# Patient Record
Sex: Male | Born: 1965 | Race: White | Hispanic: No | Marital: Married | Smoking: Never smoker
Health system: Southern US, Community
[De-identification: ages and names within clinical notes are randomized; demographics above are authoritative.]

## PROBLEM LIST (undated history)

## (undated) DIAGNOSIS — J45909 Unspecified asthma, uncomplicated: Secondary | ICD-10-CM

## (undated) HISTORY — DX: Unspecified asthma, uncomplicated: J45.909

---

## 2006-01-28 ENCOUNTER — Ambulatory Visit (HOSPITAL_COMMUNITY): Admission: RE | Admit: 2006-01-28 | Discharge: 2006-01-28 | Payer: Self-pay | Admitting: Family Medicine

## 2011-06-30 ENCOUNTER — Encounter: Payer: Self-pay | Admitting: Orthopedic Surgery

## 2011-06-30 ENCOUNTER — Ambulatory Visit: Payer: Self-pay | Admitting: Orthopedic Surgery

## 2017-05-23 ENCOUNTER — Other Ambulatory Visit (HOSPITAL_COMMUNITY): Payer: Self-pay | Admitting: Internal Medicine

## 2017-05-23 ENCOUNTER — Ambulatory Visit (HOSPITAL_COMMUNITY)
Admission: RE | Admit: 2017-05-23 | Discharge: 2017-05-23 | Disposition: A | Discharge status: Home / Self Care | Payer: Self-pay | Source: Ambulatory Visit | Attending: Internal Medicine | Admitting: Internal Medicine

## 2017-05-23 DIAGNOSIS — R509 Fever, unspecified: Secondary | ICD-10-CM | POA: Insufficient documentation

## 2017-05-23 DIAGNOSIS — R05 Cough: Secondary | ICD-10-CM

## 2017-05-23 DIAGNOSIS — R059 Cough, unspecified: Secondary | ICD-10-CM

## 2017-05-23 DIAGNOSIS — R0602 Shortness of breath: Secondary | ICD-10-CM | POA: Insufficient documentation

## 2017-05-23 DIAGNOSIS — J189 Pneumonia, unspecified organism: Secondary | ICD-10-CM | POA: Insufficient documentation

## 2018-04-04 ENCOUNTER — Encounter (INDEPENDENT_AMBULATORY_CARE_PROVIDER_SITE_OTHER): Payer: BC Managed Care – PPO | Admitting: Ophthalmology

## 2018-05-01 IMAGING — DX DG CHEST 2V
2 series · 2 of 2 positions shown · non-contrast
Comparison: None.

CLINICAL DATA: Cough, fever.

EXAM:
CHEST  2 VIEW

[chest pa]
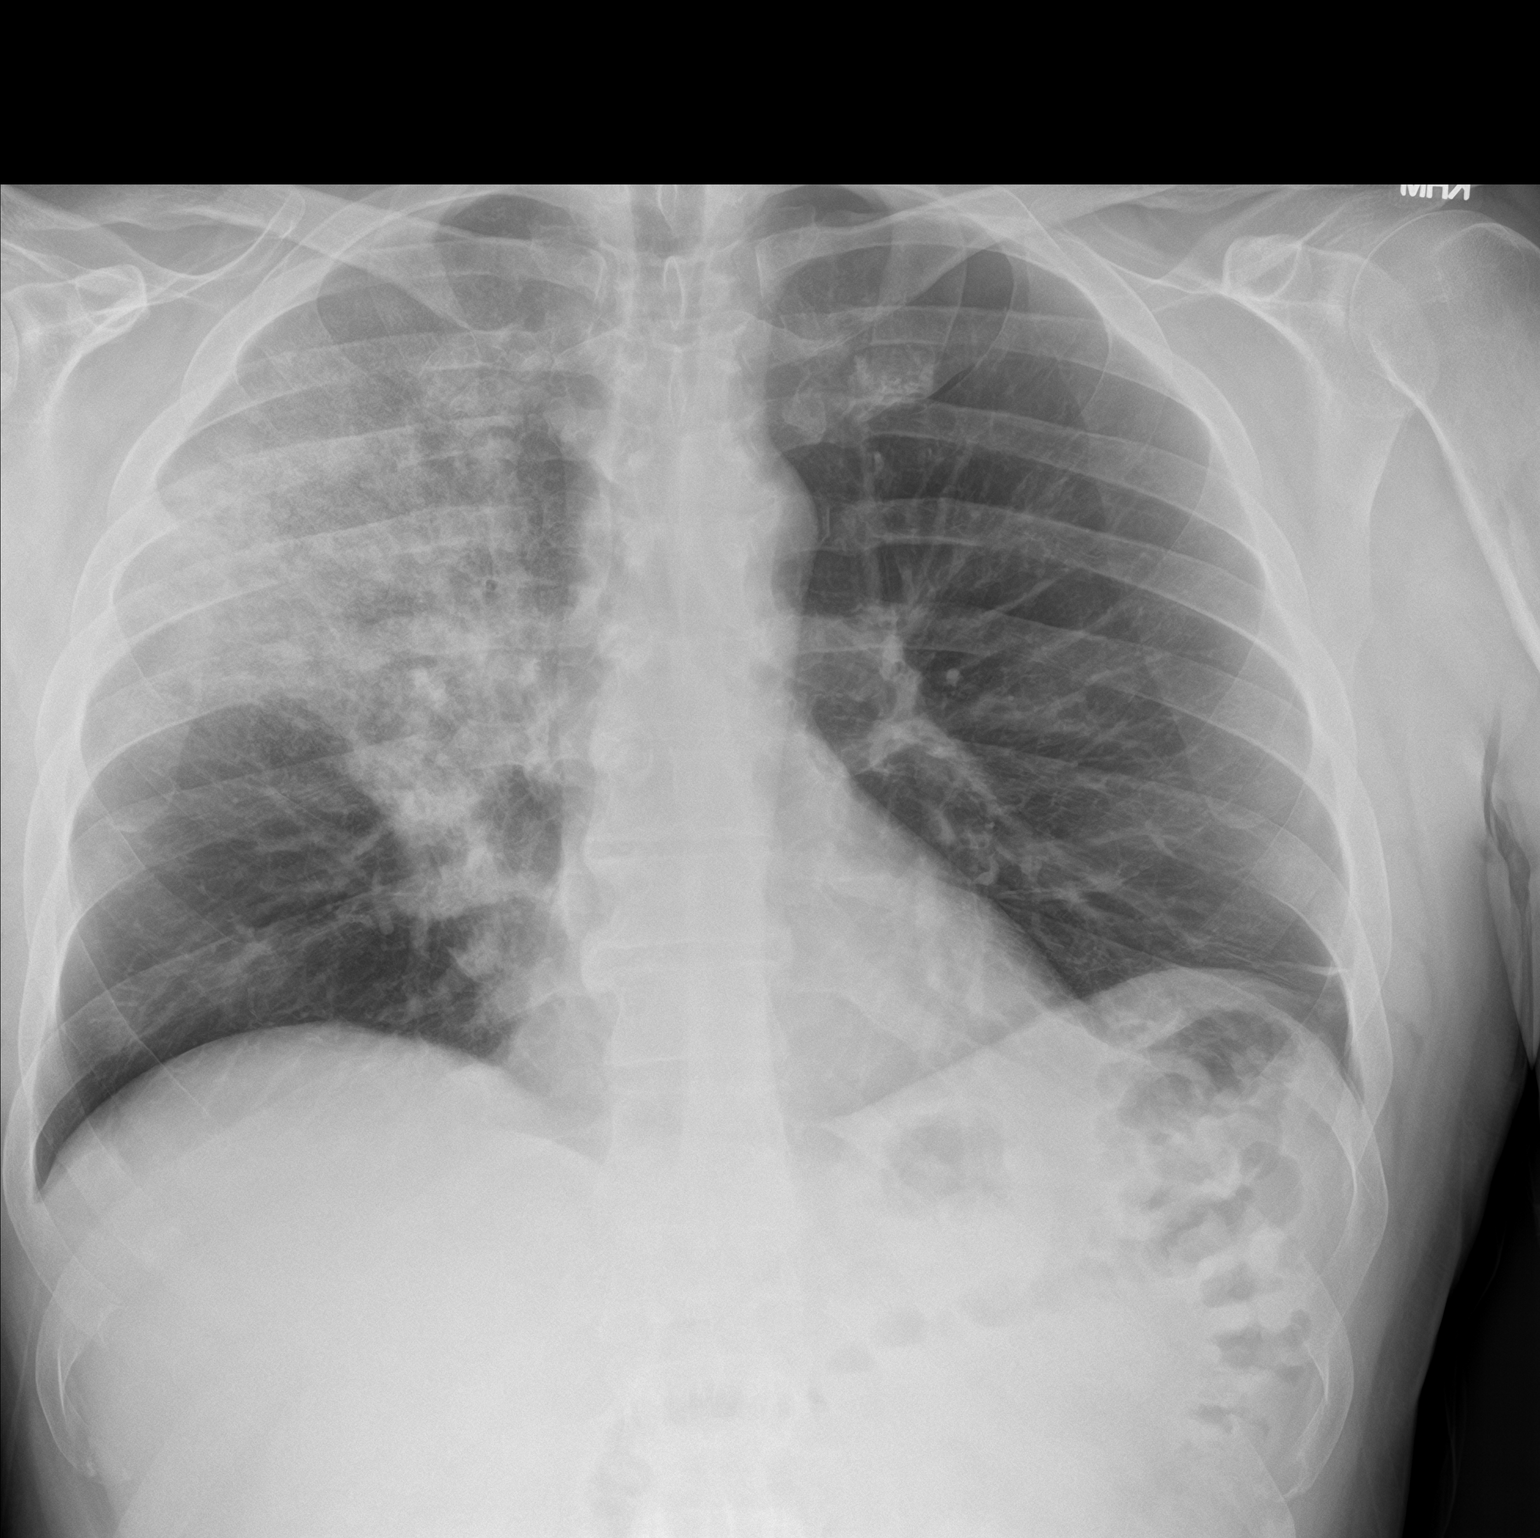

[chest lat]
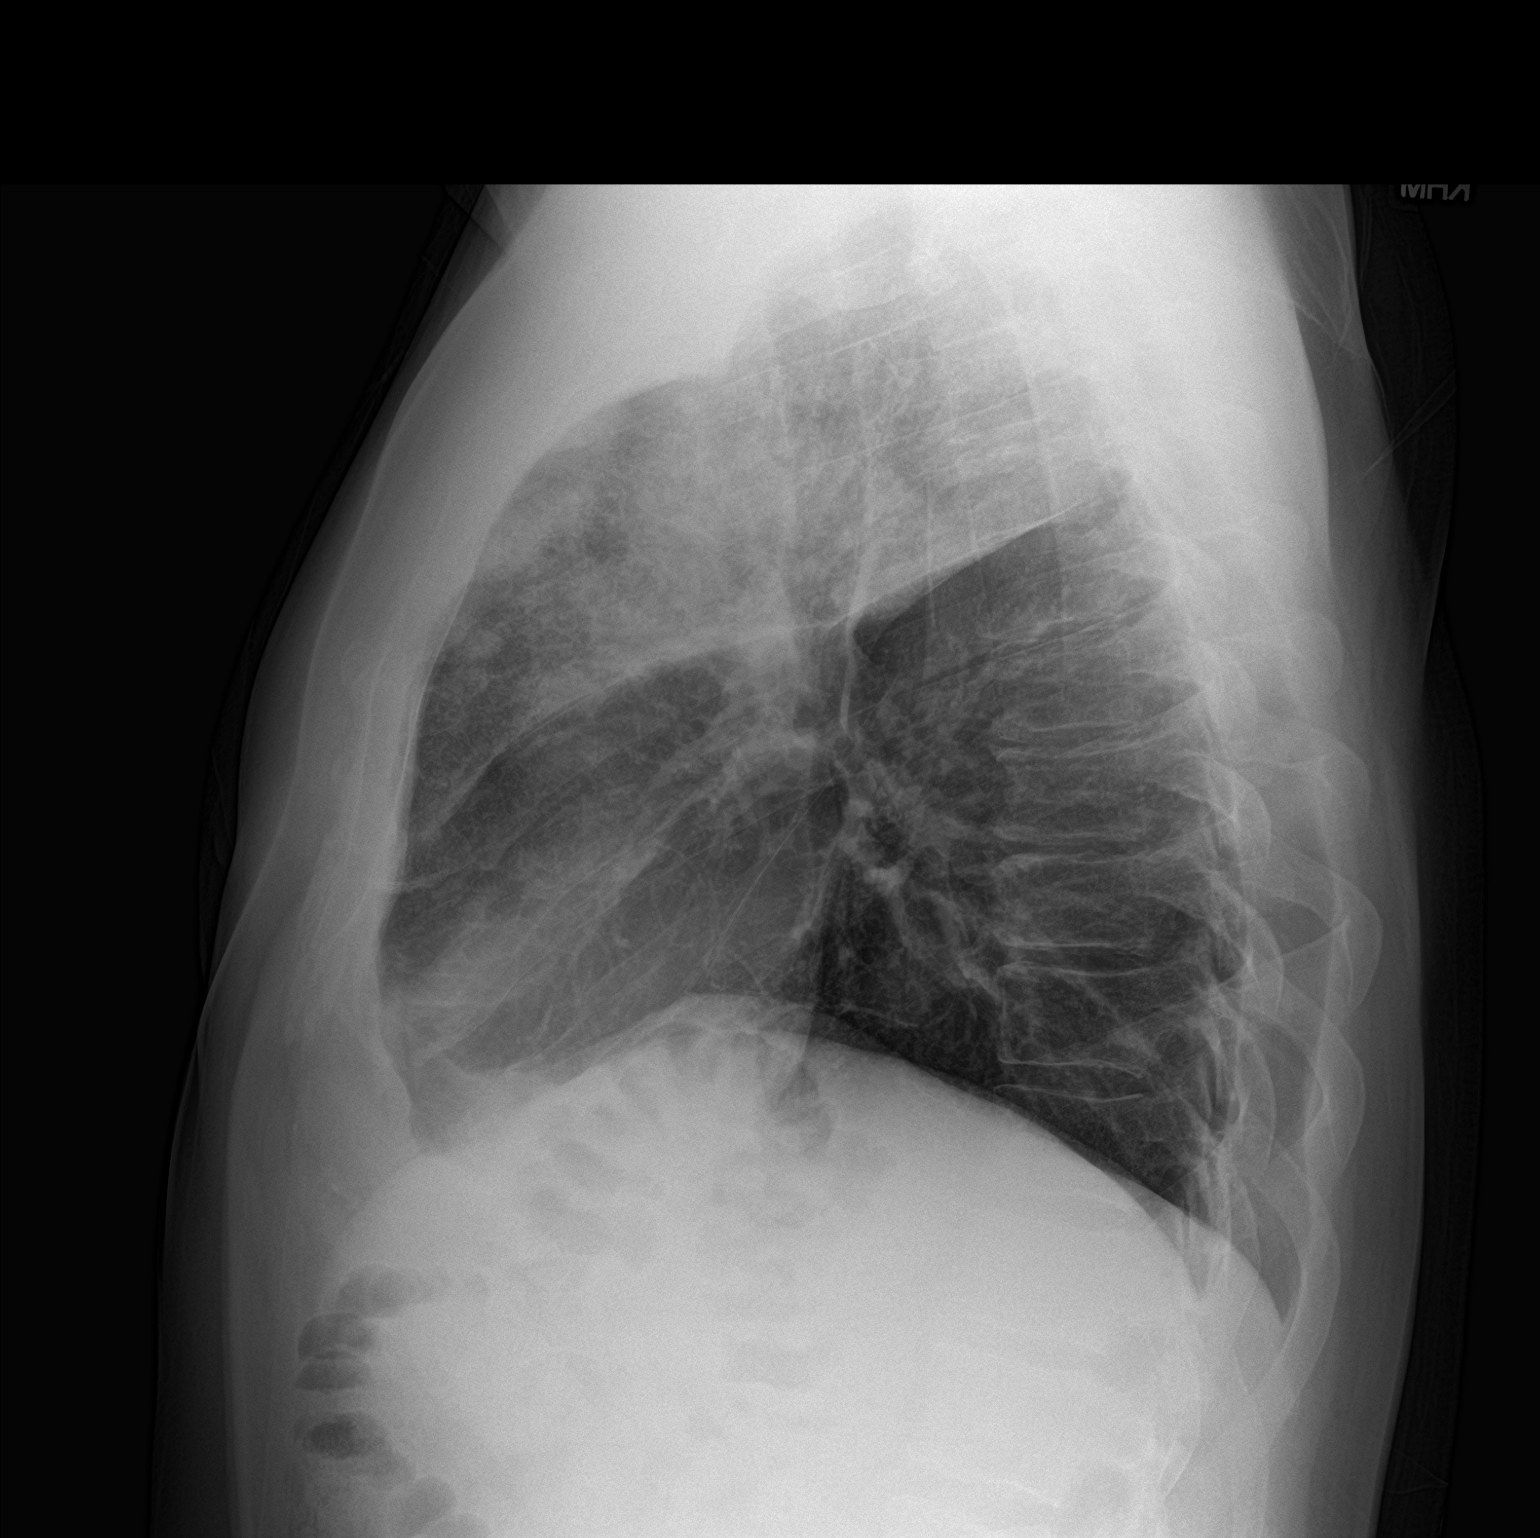

[2 of 2 positions shown; findings below may reference images not displayed]

FINDINGS: The heart size and mediastinal contours are within normal limits. No
pneumothorax or pleural effusion is noted. Left lung is clear. Right
upper lobe airspace opacity is noted consistent with pneumonia. The
visualized skeletal structures are unremarkable.
IMPRESSION: Right upper lobe pneumonia.

## 2021-03-20 ENCOUNTER — Other Ambulatory Visit: Payer: Self-pay

## 2021-03-20 ENCOUNTER — Other Ambulatory Visit (HOSPITAL_COMMUNITY): Payer: Self-pay | Admitting: *Deleted

## 2021-03-20 ENCOUNTER — Other Ambulatory Visit (HOSPITAL_COMMUNITY): Payer: Self-pay | Admitting: Student

## 2021-03-20 ENCOUNTER — Ambulatory Visit (HOSPITAL_COMMUNITY)
Admission: RE | Admit: 2021-03-20 | Discharge: 2021-03-20 | Disposition: A | Discharge status: Home / Self Care | Payer: BC Managed Care – PPO | Source: Ambulatory Visit | Attending: Student | Admitting: Student

## 2021-03-20 DIAGNOSIS — R0989 Other specified symptoms and signs involving the circulatory and respiratory systems: Secondary | ICD-10-CM

## 2021-05-27 ENCOUNTER — Institutional Professional Consult (permissible substitution): Payer: BC Managed Care – PPO | Admitting: Pulmonary Disease

## 2021-07-14 ENCOUNTER — Other Ambulatory Visit: Payer: Self-pay

## 2021-07-14 ENCOUNTER — Encounter: Payer: Self-pay | Admitting: Internal Medicine

## 2021-07-14 ENCOUNTER — Ambulatory Visit: Payer: BC Managed Care – PPO | Admitting: Internal Medicine

## 2021-07-14 DIAGNOSIS — J449 Chronic obstructive pulmonary disease, unspecified: Secondary | ICD-10-CM

## 2021-07-14 DIAGNOSIS — J4489 Other specified chronic obstructive pulmonary disease: Secondary | ICD-10-CM | POA: Insufficient documentation

## 2021-07-14 MED ORDER — MONTELUKAST SODIUM 10 MG PO TABS: ORAL_TABLET | ORAL | 2 refills | Status: AC

## 2021-07-14 MED ORDER — AMOXICILLIN-POT CLAVULANATE 875-125 MG PO TABS: 1.0000 | ORAL_TABLET | Freq: Two times a day (BID) | ORAL | 0 refills | Status: AC

## 2021-07-14 MED ORDER — BUDESONIDE-FORMOTEROL FUMARATE 160-4.5 MCG/ACT IN AERO: INHALATION_SPRAY | RESPIRATORY_TRACT | 12 refills | Status: DC

## 2021-07-14 MED ORDER — PREDNISONE 10 MG PO TABS: ORAL_TABLET | ORAL | 0 refills | Status: DC

## 2021-07-14 MED ORDER — BREZTRI AEROSPHERE 160-9-4.8 MCG/ACT IN AERO: 2.0000 | INHALATION_SPRAY | Freq: Two times a day (BID) | RESPIRATORY_TRACT | 0 refills | Status: DC

## 2021-07-14 NOTE — Progress Notes (Signed)
Russell Gates, male    DOB: [birth year],    MRN: [id number]   Brief patient profile:  68 yowm never smoker  onset of allergy/ asthma late teens and completely gone by mid 20s s need for any inhalers or allergy shots then around 2017 noted  intermittent esp in fall nasal congestion then more perennial  x 2020 assoc with sob and constant sensation of nasal congestion esp in ams with green tint so referred to pulmonary clinic in [facility]  07/14/2021 by Dr Delphina Cahill for dx of Chronic rhinitis AB x sev  month remission p abx/ prednisone started back up July 2022 and daily symptoms since.     History of Present Illness  07/14/2021  Pulmonary/ 1st office eval/ Ambri Miltner / [facility]  Chief Complaint  Patient presents with   Consult    cough, congestion,ear drainage, sob with exertion. 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      Also denies any obvious fluctuation of symptoms with weather or environmental changes or other aggravating or alleviating factors except as outlined above   No unusual exposure hx or h/o childhood pna/ asthma or knowledge of premature birth.  Current Allergies, Complete Past Medical History, Past Surgical History, Family History, and Social History were reviewed in [employer] record.  ROS  The following are not active complaints unless bolded Hoarseness, sore throat, dysphagia, dental problems, itching, sneezing,  nasal congestion or discharge of excess mucus or purulent secretions, ear ache,   fever, chills, sweats, unintended wt loss  or wt gain, classically pleuritic or exertional cp,  orthopnea pnd or arm/hand swelling  or leg swelling, presyncope, palpitations, abdominal pain, anorexia, nausea, vomiting, diarrhea  or change in bowel habits or change in bladder habits, change in stools or change in urine, dysuria, hematuria,  rash, arthralgias, visual complaints, headache, numbness, weakness or ataxia or problems with walking or coordination,  change in mood or  memory.             No past medical history on file.  Outpatient Medications Prior to Visit  Medication Sig Dispense Refill   albuterol (PROVENTIL) (2.5 MG/3ML) 0.083% nebulizer solution Take by nebulization.     albuterol (VENTOLIN HFA) 108 (90 Base) MCG/ACT inhaler Inhale 2 puffs into the lungs every 4 (four) hours as needed.     No facility-administered medications prior to visit.     Objective:     BP 120/78   Pulse 80   Temp 98.2 F (36.8 C)   Ht 6' (1.829 m)   Wt 215 lb 0.6 oz (97.5 kg)   SpO2 97%   BMI 29.16 kg/m   SpO2: 97 %  Pleasant amb wm nad   HEENT : pt wearing mask not removed for exam due to covid -19 concerns.    NECK :  without JVD/Nodes/TM/ nl carotid upstrokes bilaterally   LUNGS: no acc muscle use,  Nl contour chest which is clear to A and P bilaterally  without cough on insp or exp maneuvers   CV:  RRR  no s3 or murmur or increase in P2, and no edema   ABD:  soft and nontender with nl inspiratory excursion in the supine position. No bruits or organomegaly appreciated, bowel sounds nl  MS:  Nl gait/ ext warm without deformities, calf tenderness, cyanosis or clubbing No obvious joint restrictions   SKIN: warm and dry without lesions    NEURO:  alert, approp, nl sensorium with  no motor or cerebellar deficits apparent.     Labs ordered 07/14/2021  :  allergy profile        I personally reviewed images and agree with radiology impression as follows:  CXR:   03/20/21  No active cardiopulmonary disease.       Assessment   No problem-specific Assessment & Plan notes found for this encounter.     Sandrea Hughs, MD 07/14/2021

## 2021-07-14 NOTE — Assessment & Plan Note (Addendum)
Onset around 2017 initially just in fall there year round since 2020  -  07/14/2021  After extensive coaching inhaler device,  effectiveness =   80% > try symbicort 160 and singulair 10 mg q pm  -  Allergy profile 07/14/2021 >  Eos 0. /  IgE  Pending   Classic rhinitis/ AB pattern strongly suggests this is all allergy related so for now try  1) symbicort 160 2bid and prn saba Re SABA :  I spent extra time with pt today reviewing appropriate use of albuterol for prn use on exertion with the following points: 1) saba is for relief of sob that does not improve by walking a slower pace or resting but rather if the pt does not improve after trying this first. 2) If the pt is convinced, as many are, that saba helps recover from activity faster then it's easy to tell if this is the case by re-challenging : ie stop, take the inhaler, then p 5 minutes try the exact same activity (intensity of workload) that just caused the symptoms and see if they are substantially diminished or not after saba 3) if there is an activity that reproducibly causes the symptoms, try the saba 15 min before the activity on alternate days   If in fact the saba really does help, then fine to continue to use it prn but advised may need to look closer at the maintenance regimen being used to achieve better control of airways disease with exertion.   2) Singulair 10 mg q pm  3) repeat pred x 6 days/ augmentin x 10 days   4) f/u in 6 weeks with refer to allergy and / or sinus CT next steps if not improving.          Each maintenance medication was reviewed in detail including emphasizing most importantly the difference between maintenance and prns and under what circumstances the prns are to be triggered using an action plan format where appropriate.  Total time for H and P, chart review, counseling, reviewing hfa device(s) and generating customized AVS unique to this new pt office visit / same day charting = 

## 2021-07-14 NOTE — Addendum Note (Signed)
Addended by: Carleene Mains D on: 07/14/2021 05:07 PM   Modules accepted: Orders

## 2021-07-14 NOTE — Patient Instructions (Addendum)
Plan A = Automatic = Always=    symbicort 160 Take 2 puffs first thing in am and then another 2 puffs about 12 hours later.    Work on inhaler technique:  relax and gently blow all the way out then take a nice smooth full deep breath back in, triggering the inhaler at same time you start breathing in.  Hold for up to 5 seconds if you can. Blow out thru nose. Rinse and gargle with water when done.  If mouth or throat bother you at all,  try brushing teeth/gums/tongue with arm and hammer toothpaste/ make a slurry and gargle and spit out.     Singulair 10 mg each pm   Plan B = Backup (to supplement plan A, not to replace it) Only use your albuterol inhaler as a rescue medication to be used if you can't catch your breath by resting or doing a relaxed purse lip breathing pattern.  - The less you use it, the better it will work when you need it. - Ok to use the inhaler up to 2 puffs  every 4 hours if you must but call for appointment if use goes up over your usual need - Don't leave home without it !!  (think of it like the spare tire for your car)   Plan C = Crisis (instead of Plan B but only if Plan B stops working) - only use your albuterol nebulizer if you first try Plan B and it fails to help > ok to use the nebulizer up to every 4 hours but if start needing it regularly call for immediate appointment  Augmentin 875 mg take one pill twice daily  X 10 days - take at breakfast and supper with large glass of water.  It would help reduce the usual side effects (diarrhea and yeast infections) if you ate cultured yogurt at lunch.   Prednisone 10 mg take  4 each am x 2 days,   2 each am x 2 days,  1 each am x 2 days and stop     Please remember to go to the lab department @ [facility] for your tests - we will call you with the results when they are available.       Please schedule a follow up office visit in 6 weeks, call sooner if needed

## 2021-07-21 LAB — CBC WITH DIFFERENTIAL/PLATELET
Basophils Absolute: 0.1 10*3/uL (ref 0.0–0.2)
Basos: 1 %
EOS (ABSOLUTE): 0.4 10*3/uL (ref 0.0–0.4)
Eos: 6 %
Hematocrit: 41.7 % (ref 37.5–51.0)
Hemoglobin: 14.3 g/dL (ref 13.0–17.7)
Immature Grans (Abs): 0 10*3/uL (ref 0.0–0.1)
Immature Granulocytes: 0 %
Lymphocytes Absolute: 2.3 10*3/uL (ref 0.7–3.1)
Lymphs: 34 %
MCH: 31.9 pg (ref 26.6–33.0)
MCHC: 34.3 g/dL (ref 31.5–35.7)
MCV: 93 fL (ref 79–97)
Monocytes Absolute: 0.7 10*3/uL (ref 0.1–0.9)
Monocytes: 10 %
Neutrophils Absolute: 3.3 10*3/uL (ref 1.4–7.0)
Neutrophils: 49 %
Platelets: 255 10*3/uL (ref 150–450)
RBC: 4.48 x10E6/uL (ref 4.14–5.80)
RDW: 12 % (ref 11.6–15.4)
WBC: 6.8 10*3/uL (ref 3.4–10.8)

## 2021-07-21 LAB — IGE: IgE (Immunoglobulin E), Serum: 836 IU/mL — ABNORMAL HIGH (ref 6–495)

## 2021-07-22 ENCOUNTER — Other Ambulatory Visit: Payer: Self-pay

## 2021-07-22 DIAGNOSIS — T7840XA Allergy, unspecified, initial encounter: Secondary | ICD-10-CM

## 2021-07-22 NOTE — Progress Notes (Deleted)
Spoke to patient. He states he feels he has improved a little but thinks the referral is a good idea. Order placed for referral.

## 2021-08-26 ENCOUNTER — Encounter: Payer: Self-pay | Admitting: Internal Medicine

## 2021-08-26 ENCOUNTER — Ambulatory Visit: Payer: BC Managed Care – PPO | Admitting: Internal Medicine

## 2021-08-26 ENCOUNTER — Other Ambulatory Visit: Payer: Self-pay

## 2021-08-26 DIAGNOSIS — J449 Chronic obstructive pulmonary disease, unspecified: Secondary | ICD-10-CM | POA: Diagnosis not present

## 2021-08-26 MED ORDER — AMOXICILLIN-POT CLAVULANATE 875-125 MG PO TABS: 1.0000 | ORAL_TABLET | Freq: Two times a day (BID) | ORAL | 0 refills | Status: AC

## 2021-08-26 MED ORDER — PREDNISONE 10 MG PO TABS: ORAL_TABLET | ORAL | 0 refills | Status: DC

## 2021-08-26 NOTE — Progress Notes (Signed)
Russell Gates, male    DOB: March 23, 1966,    MRN: [id number]   Brief patient profile:  55 yowm never smoker  onset of allergy/ asthma around age 55 then completely gone by early 20s s need for any inhalers or allergy shots then around 2017 noted  intermittent esp in fall nasal congestion then more perennial  x 2020 assoc with sob and constant sensation of nasal congestion esp in ams with green tint so referred to pulmonary clinic in [place]  07/14/2021 by Dr Catalina Pizza for dx of Chronic rhinitis AB x sev  month remission p abx/ prednisone started back up July 2022 and daily symptoms since.     History of Present Illness  07/14/2021  Pulmonary/ 1st office eval/ Jennessy Sandridge / [facility]  Chief Complaint  Patient presents with   Consult    cough, congestion,ear drainage, sob with exertion. 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  Work on inhaler technique:   Singulair 10 mg each pm  Plan B = Backup (to supplement plan A, not to replace it) Only use your albuterol inhaler as a rescue medication  Plan C = Crisis (instead of Plan B but only if Plan B stops working) - only use your albuterol nebulizer if you first try Plan B  Augmentin 875 mg take one pill twice daily  X 10 days  Prednisone 10 mg take  4 each am x 2 days,   2 each am x 2 days,  1 each am x 2 days and stop   Allergy profile 07/14/2021 >  Eos 0.4 /  IgE  836 > referred to allergy      08/26/2021  f/u ov/[place] office/Ioannis Schuh re: AB maint on symb 160 but not consistent with it - has allergy appt p holidays  Chief Complaint  Patient presents with    Follow-up    Cough, congestion, ear drainage have improved over past 5-7 days but still has some drainage. Patient has "flare ups" Has appt. 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    Also denies any obvious fluctuation of symptoms with weather or environmental changes or other aggravating or alleviating factors except as outlined above   No unusual exposure hx or h/o childhood pna  or knowledge of premature birth.  Current Allergies, Complete Past Medical History, Past Surgical History, Family History, and Social History were reviewed in [employer] record.  ROS  The following are not active complaints unless bolded Hoarseness, sore throat, dysphagia, dental problems, itching, sneezing,  nasal congestion or discharge of excess mucus or purulent secretions, ear ache,   fever, chills, sweats, unintended wt loss or wt gain, classically pleuritic or exertional cp,  orthopnea pnd or arm/hand swelling  or leg swelling, presyncope, palpitations, abdominal pain, anorexia, nausea, vomiting, diarrhea  or change in bowel habits or change in bladder habits, change in stools or change in urine, dysuria, hematuria,  rash, arthralgias, visual complaints, headache, numbness, weakness or ataxia or problems with walking or coordination,  change in mood or  memory.        Current Meds  Medication Sig   albuterol (PROVENTIL) (2.5 MG/3ML) 0.083% nebulizer solution Take by nebulization.    albuterol (VENTOLIN HFA) 108 (90 Base) MCG/ACT inhaler Inhale 2 puffs into the lungs every 4 (four) hours as needed.   Budeson-Glycopyrrol-Formoterol (BREZTRI AEROSPHERE) 160-9-4.8 MCG/ACT AERO Inhale 2 puffs into the lungs in the morning and at bedtime.   budesonide-formoterol (SYMBICORT) 160-4.5 MCG/ACT inhaler Take 2 puffs first thing in am and then another 2 puffs about 12 hours later.   montelukast (SINGULAIR) 10 MG tablet One at bedtime every night   predniSONE (DELTASONE) 10 MG tablet Take  4 each am x 2 days,   2 each am x 2 days,  1 each am x 2 days and stop                   Objective:    Wt Readings from Last 3 Encounters:  08/26/21 217 lb 1.3 oz (98.5 kg)  07/14/21 215 lb 0.6 oz (97.5 kg)     Vital signs reviewed  08/26/2021  - Note at rest 02 sats  98% on RA   General appearance:   amb wm  nasal tone   nad   HEENT : pt wearing mask not removed for exam due to covid -19 concerns.    NECK :  without JVD/Nodes/TM/ nl carotid upstrokes bilaterally   LUNGS: no acc muscle use,  Nl contour chest which is clear to A and P bilaterally without cough on insp or exp maneuvers   CV:  RRR  no s3 or murmur or increase in P2, and no edema   ABD:  soft and nontender with nl inspiratory excursion in the supine position. No bruits or organomegaly appreciated, bowel sounds nl  MS:  Nl gait/ ext warm without deformities, calf tenderness, cyanosis or clubbing No obvious joint restrictions   SKIN: warm and dry without lesions    NEURO:  alert, approp, nl sensorium with  no motor or cerebellar deficits apparent.          Assessment

## 2021-08-26 NOTE — Patient Instructions (Addendum)
I emphasized that nasal steroids (flonase, nasacort aq)  have no immediate benefit in terms of improving symptoms.  To help them reached the target tissue, the patient should use Afrin two puffs every 12 hours applied one min before using the nasal steroids.  Afrin should be stopped after no more than 5 days.  If the symptoms worsen, Afrin can be restarted after 5 days off of therapy to prevent rebound congestion from overuse of Afrin.  I also emphasized that in no way are nasal steroids a concern in terms of "addiction".  Augmentin 875 mg take one pill twice daily  X 10 days - take at breakfast and supper with large glass of water.  It would help reduce the usual side effects (diarrhea and yeast infections) if you ate cultured yogurt at lunch.   Prednisone 10 mg take  4 each am x 2 days,   2 each am x 2 days,  1 each am x 2 days and stop   Keep appt to see Allergy    If you are satisfied with your treatment plan,  let your doctor know and he/she can either refill your medications or you can return here when your prescription runs out.     If in any way you are not 100% satisfied,  please tell us.  If 100% better, tell your friends!  Pulmonary follow up is as needed

## 2021-08-26 NOTE — Assessment & Plan Note (Signed)
Onset around 2017 -   initially just in fall then year round since 2020  -  07/14/2021  After extensive coaching inhaler device,  effectiveness =   80% > try symbicort 160 and singulair 10 mg q pm  -  Allergy profile 07/14/2021 >  Eos 0.4 /  IgE  836 > referred to allergy   - 08/26/2021 flared though not consistent with symb 160/ assoc with nasal symptom flare - 08/26/2021 added flonase with 6 d pred/ augmentin x 10 and allergy eval  in Jan 2022 planned   Flare of symptoms related to non-adherence and poor control of nasal symptoms.  - The proper method of use, as well as anticipated side effects, of a metered-dose inhaler were discussed and demonstrated to the patient using teach back method.    - also I emphasized that nasal steroids have no immediate benefit in terms of improving symptoms.  To help them reached the target tissue, the patient should use Afrin two puffs every 12 hours applied one min before using the nasal steroids.  Afrin should be stopped after no more than 5 days.  If the symptoms worsen, Afrin can be restarted after 5 days off of therapy to prevent rebound congestion from overuse of Afrin.  I also emphasized that in no way are nasal steroids a concern in terms of "addiction".   F/u here can be prn          Each maintenance medication was reviewed in detail including emphasizing most importantly the difference between maintenance and prns and under what circumstances the prns are to be triggered using an action plan format where appropriate.  Total time for H and P, chart review, counseling, reviewing hfa/ nasal device(s) and generating customized AVS unique to this summary final pulmonary  office visit / same day charting  > 30 min

## 2021-09-24 ENCOUNTER — Ambulatory Visit: Payer: BC Managed Care – PPO | Admitting: Allergy & Immunology

## 2021-10-15 ENCOUNTER — Other Ambulatory Visit: Payer: Self-pay

## 2021-10-15 ENCOUNTER — Telehealth: Payer: Self-pay | Admitting: Internal Medicine

## 2021-10-15 MED ORDER — AMOXICILLIN-POT CLAVULANATE 875-125 MG PO TABS: 1.0000 | ORAL_TABLET | Freq: Two times a day (BID) | ORAL | 0 refills | Status: AC

## 2021-10-15 MED ORDER — PREDNISONE 10 MG PO TABS: ORAL_TABLET | ORAL | 0 refills | Status: AC

## 2021-10-15 NOTE — Telephone Encounter (Signed)
Primary Pulmonologist: Dr. Sherene Sires  Last office visit and with whom: 08/26/2021 [facility]  What do we see them for (pulmonary problems): asthmatic bronchitis Last OV assessment/plan: see below   Was appointment offered to patient (explain)?  No   Reason for call:  Congestion, Ear aches, cough up yellow/green mucus. No fever.  Neg flu and covid tests. Patient states he is having same symptoms as he did during OV and would like augmentin and prednisone.   Dr. Sherene Sires please advise  Pharmacy: [facility].  Not on File   There is no immunization history on file for this patient.  Assessment              Assessment & Plan Note by Nyoka Cowden, MD at 08/26/2021 9:38 AM  Author: Nyoka Cowden, MD Author Type: Physician Filed: 08/26/2021  9:39 AM  Note Status: Written Cosign: Cosign Not Required Encounter Date: 08/26/2021  Problem: Asthmatic bronchitis , chronic (HCC)  Editor: Nyoka Cowden, MD (Physician)             Onset around 2017 -   initially just in fall then year round since 2020  -  07/14/2021  After extensive coaching inhaler device,  effectiveness =   80% > try symbicort 160 and singulair 10 mg q pm  -  Allergy profile 07/14/2021 >  Eos 0.4 /  IgE  836 > referred to allergy   - 08/26/2021 flared though not consistent with symb 160/ assoc with nasal symptom flare - 08/26/2021 added flonase with 6 d pred/ augmentin x 10 and allergy eval  in Jan 2022 planned    Flare of symptoms related to non-adherence and poor control of nasal symptoms.   - The proper method of use, as well as anticipated side effects, of a metered-dose inhaler were discussed and demonstrated to the patient using teach back method.     - also I emphasized that nasal steroids have no immediate benefit in terms of improving symptoms.  To help them reached the target tissue, the patient should use Afrin two puffs every 12 hours applied one min before using the nasal steroids.  Afrin should be stopped  after no more than 5 days.  If the symptoms worsen, Afrin can be restarted after 5 days off of therapy to prevent rebound congestion from overuse of Afrin.  I also emphasized that in no way are nasal steroids a concern in terms of "addiction".    F/u here can be prn            Each maintenance medication was reviewed in detail including emphasizing most importantly the difference between maintenance and prns and under what circumstances the prns are to be triggered using an action plan format where appropriate.   Total time for H and P, chart review, counseling, reviewing hfa/ nasal device(s) and generating customized AVS unique to this summary final pulmonary  office visit / same day charting  > 30 min              Patient Instructions by Nyoka Cowden, MD at 08/26/2021 8:45 AM  Author: Nyoka Cowden, MD Author Type: Physician Filed: 08/26/2021  9:27 AM  Note Status: Addendum Cosign: Cosign Not Required Encounter Date: 08/26/2021  Editor: Nyoka Cowden, MD (Physician)      Prior Versions: 1. Nyoka Cowden, MD (Physician) at 08/26/2021  9:24 AM - Signed  I emphasized that nasal steroids (flonase, nasacort aq)  have no immediate benefit in  terms of improving symptoms.  To help them reached the target tissue, the patient should use Afrin two puffs every 12 hours applied one min before using the nasal steroids.  Afrin should be stopped after no more than 5 days.  If the symptoms worsen, Afrin can be restarted after 5 days off of therapy to prevent rebound congestion from overuse of Afrin.  I also emphasized that in no way are nasal steroids a concern in terms of "addiction".   Augmentin 875 mg take one pill twice daily  X 10 days - take at breakfast and supper with large glass of water.  It would help reduce the usual side effects (diarrhea and yeast infections) if you ate cultured yogurt at lunch.    Prednisone 10 mg take  4 each am x 2 days,   2 each am x 2 days,  1 each am x 2 days  and stop    Keep appt to see Allergy     If you are satisfied with your treatment plan,  let your doctor know and he/she can either refill your medications or you can return here when your prescription runs out.      If in any way you are not 100% satisfied,  please tell us.  If 100% better, tell your friends!   Pulmonary follow up is as needed

## 2021-10-15 NOTE — Telephone Encounter (Signed)
Called and spoke to patient. Medications sent to [facility] pharmacy. Nothing further needed.

## 2021-10-15 NOTE — Telephone Encounter (Signed)
Fine with me: Augmentin 875 mg take one pill twice daily  X 10 days - take at breakfast and supper with large glass of water.  It would help reduce the usual side effects (diarrhea and yeast infections) if you ate cultured yogurt at lunch.   Prednisone 10 mg take  4 each am x 2 days,   2 each am x 2 days,  1 each am x 2 days and stop

## 2021-10-27 ENCOUNTER — Other Ambulatory Visit: Payer: Self-pay

## 2021-10-27 ENCOUNTER — Encounter: Payer: Self-pay | Admitting: Allergy & Immunology

## 2021-10-27 ENCOUNTER — Ambulatory Visit: Payer: BC Managed Care – PPO | Admitting: Allergy & Immunology

## 2021-10-27 VITALS — BP 130/88 | HR 73 | Resp 18 | Ht 72.0 in

## 2021-10-27 DIAGNOSIS — K9049 Malabsorption due to intolerance, not elsewhere classified: Secondary | ICD-10-CM

## 2021-10-27 DIAGNOSIS — J4540 Moderate persistent asthma, uncomplicated: Secondary | ICD-10-CM | POA: Diagnosis not present

## 2021-10-27 DIAGNOSIS — J310 Chronic rhinitis: Secondary | ICD-10-CM | POA: Diagnosis not present

## 2021-10-27 MED ORDER — CARBINOXAMINE MALEATE 4 MG PO TABS: 4.0000 mg | ORAL_TABLET | Freq: Every day | ORAL | 5 refills | Status: AC | PRN

## 2021-10-27 MED ORDER — IPRATROPIUM BROMIDE 0.06 % NA SOLN: NASAL | 5 refills | Status: AC

## 2021-10-27 MED ORDER — PREDNISONE 10 MG PO TABS: ORAL_TABLET | ORAL | 0 refills | Status: AC

## 2021-10-27 NOTE — Progress Notes (Signed)
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 We are going to get some labs to look for serious difficult to control causes of asthma/breathing problems.  We are going to start a prednisone burst today. I also think that we need to restart the Symbicort two puffs twice daily EVERY day. I want to see if this medication can help reverse your lung function. Consider starting an injectable medication like Fasenra for better control of your breathing. This medication targets a white blood cell called eosinophils which can hang out in the airways and make your breathing worse.  Controller: Symbicort two puffs twice daily with spacer Before physical activity (if this is a trigger): albuterol 2 puffs 15 minutes before physical activity Rescue: 4 puffs of albuterol every 4-6 hours  Chronic rhinitis Testing today showed: positives only to dust mites Copy of testing results provided. Avoidance measures provided.  Start taking nasal Atrovent one spray per nostril every 8 hours as needed (CAN BE OVER DRYING, so be careful). Start taking carbinoxmine 4 mg every 8 hours as needed (CAN CAUSE SLEEPINESS, but this gets better over time).   Adverse food reaction Testing was negative to all of the foods that we tested today. Food has great negative predictive value, so if this is negative we are likely to believe it.  Testing ruled out > 95% of all food allergies today. Copy of the testing results provided today. We are  going to confirm the shellfish with some labs today.  4. Follow up in 2-4 weeks or sooner if needed.     This note in its entirety was forwarded to the Provider who requested this consultation.  Subjective:   Russell Gates is a 56 y.o. male presenting today for evaluation of  Chief Complaint  Patient presents with   Asthma   Allergic Rhinitis     Russell Gates has a history of the following: Patient Active Problem List   Diagnosis Date Noted   Asthmatic bronchitis , chronic (HCC) 07/14/2021    History obtained from: chart review and patient.  Melida Gimenez was referred by Benita Stabile, MD.     Russell Gates is a 56 y.o. male presenting for an evaluation of asthma and allergies, including food and environmental allergies .  Asthma/Respiratory Symptom History: He had asthma as a child. He was placed on Symbicort around December 2 puffs twice daily.  He has not taken the Symbicort in around a month or so. He has only taken it for a couple of days in a row during flares.    Allergic Rhinitis Symptom History: He has had continuous drainage from his sinuses. He has been on antibiotics around 3 times since November.  Antibiotics clear it up completely. He completed the last course of antibiotics one week ago today. Then today he reports that he had ear congestion. He did use a nose spray right before Christmas - this was Afrin which he has only used a couple of times. He has used something else together. He has not seen  ENT. He did have allergies and asthma and had allergy shots as a child. This was probably a couple of years. It definitely helped. As a child, he was prescribed Marax (theophylline/ephedra/hydroxyzine) which helped quite a bit. He would only take this when he had a flare up. He normally gets prednisone a couple of times per year. It does help.    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 He does have an EpiPen which he keeps  with him at all times. He has had intermittent episodes where he develops throat closure and urticaria after eating certain things, but this is not consistent. The last time that this happened was around September 2022.  This typically occurs around 12-15 hours after eating something. He did go to the ED once in [place] around two years ago. This was the worst case that he has ever had. Lips were turning blue. He had to get IV Benadryl for this episode. He passed out that time. They had gone to an [employer] and had an appetizer. This was 2-3 pm in the afternoon. Then they had [employer] for supper. He had three beers yesterday at a [employer], he had drainage from drinking the beer. This is clear drainage.    Skin Symptom History: He reports whelts on his body around 5-6 times. He is not sure that this was related to food.  Otherwise, there is no history of other atopic diseases, including drug allergies, stinging insect allergies, or contact dermatitis. There is no significant infectious history. Vaccinations are up to date.    Past Medical History: Patient Active Problem List   Diagnosis Date Noted   Asthmatic bronchitis , chronic (HCC) 07/14/2021    Medication List:  Allergies as of 10/27/2021   Not on File      Medication List        Accurate as of October 27, 2021 12:50 PM. If you have any questions, ask your nurse or doctor.          albuterol (2.5 MG/3ML) 0.083% nebulizer solution Commonly known as: PROVENTIL Take by nebulization.   albuterol 108 (90 Base) MCG/ACT inhaler Commonly known as: VENTOLIN HFA Inhale 2 puffs into the lungs every 4 (four) hours as needed.   budesonide-formoterol 160-4.5 MCG/ACT inhaler Commonly known as: Symbicort Take 2 puffs first thing in am and then another 2 puffs about 12 hours later.   Carbinoxamine Maleate 4 MG Tabs Take 1 tablet (4 mg total) by mouth daily as needed. Started by: Alfonse Spruce, MD   ipratropium  0.06 % nasal spray Commonly known as: ATROVENT one spray per nostril every 8 hours as needed Started by: Alfonse Spruce, MD   montelukast 10 MG tablet Commonly known as: Singulair One at bedtime every night   predniSONE 10 MG tablet Commonly known as: DELTASONE Take 3 tabs (30mg ) twice daily for 3 days, then 2 tabs (20mg ) twice daily for 3 days, then 1 tab (10mg ) twice daily for 3 days, then STOP. What changed: additional instructions Changed by: Alfonse Spruce, MD        Birth History: non-contributory  Developmental History: non-contributory  Past Surgical History: History reviewed. No pertinent surgical history.   Family History: History reviewed. No pertinent family history.   Social History: Russell Gates lives at home with his family.  They live in a house that is 57 years old.  There is hardwood throughout the home.  They have carpeting  in some of the bedrooms.  They have gas heating and central cooling.  There are no dust mite covers on the bedding.  There is no tobacco exposure.  He has never been a smoker.  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  He does not always use a mask.     Review of Systems  Constitutional: Negative.  Negative for chills, fever, malaise/fatigue and weight loss.  HENT:  Positive for congestion. Negative for ear discharge and ear pain.        Positive for rhinorrhea.  Eyes:  Negative for pain, discharge and redness.  Respiratory:  Positive for cough and shortness of breath. Negative for sputum production and wheezing.   Cardiovascular: Negative.  Negative for chest pain and palpitations.  Gastrointestinal:  Negative for abdominal pain, constipation, diarrhea, heartburn, nausea and vomiting.  Skin: Negative.  Negative for itching and rash.  Neurological:  Negative for dizziness and headaches.  Endo/Heme/Allergies:  Negative for environmental allergies. Does not  bruise/bleed easily.      Objective:   Blood pressure 130/88, pulse 73, resp. rate 18, height 6' (1.829 m), SpO2 97 %. Body mass index is 29.44 kg/m.   Physical Exam:   Physical Exam Vitals reviewed.  Constitutional:      Appearance: He is well-developed.     Comments: Talkative.    HENT:     Head: Normocephalic and atraumatic.     Right Ear: Tympanic membrane, ear canal and external ear normal. No drainage, swelling or tenderness. Tympanic membrane is not injected, scarred, erythematous, retracted or bulging.     Left Ear: Tympanic membrane, ear canal and external ear normal. No drainage, swelling or tenderness. Tympanic membrane is not injected, scarred, erythematous, retracted or bulging.     Nose: No nasal deformity, septal deviation, mucosal edema or rhinorrhea.     Right Turbinates: Enlarged and swollen.     Left Turbinates: Enlarged and swollen.     Right Sinus: No maxillary sinus tenderness or frontal sinus tenderness.     Left Sinus: No maxillary sinus tenderness or frontal sinus tenderness.     Comments: No nasal polyps.    Mouth/Throat:     Mouth: Mucous membranes are not pale and not dry.     Pharynx: Uvula midline.     Comments: Mild cobblestoning.  Eyes:     General: Allergic shiner present.        Right eye: No discharge.        Left eye: No discharge.     Conjunctiva/sclera: Conjunctivae normal.     Right eye: Right conjunctiva is not injected. No chemosis.    Left eye: Left conjunctiva is not injected. No chemosis.    Pupils: Pupils are equal, round, and reactive to light.  Cardiovascular:     Rate and Rhythm: Normal rate and regular rhythm.     Heart sounds: Normal heart sounds.  Pulmonary:     Effort: Pulmonary effort is normal. No tachypnea, accessory muscle usage or respiratory distress.     Breath sounds: Examination of the right-middle field reveals wheezing. Examination of the left-middle field reveals wheezing. Examination of the right-lower  field reveals decreased breath sounds. Examination of the left-lower field reveals decreased breath sounds. Decreased breath sounds and wheezing present. No rhonchi or rales.     Comments: Moving air well in all lung fields.  Mild wheezing noted in bilateral lung fields. Chest:  Chest wall: No tenderness.  Abdominal:     Tenderness: There is no abdominal tenderness. There is no guarding or rebound.  Lymphadenopathy:     Head:     Right side of head: No submandibular, tonsillar or occipital adenopathy.     Left side of head: No submandibular, tonsillar or occipital adenopathy.     Cervical: No cervical adenopathy.  Skin:    Coloration: Skin is not pale.     Findings: No abrasion, erythema, petechiae or rash. Rash is not papular, urticarial or vesicular.  Neurological:     Mental Status: He is alert.  Psychiatric:        Behavior: Behavior is cooperative.     Diagnostic studies:    Spirometry: results abnormal (FEV1: 1.98/52%, FVC: 3.39/69%, FEV1/FVC: 58%).    Spirometry consistent with moderate obstructive disease. Albuterol four puffs via MDI treatment given in clinic with no improvement.  Allergy Studies:     Airborne Adult Perc - 10/27/21 1011     Time Antigen Placed 1011    Allergen Manufacturer Waynette Buttery    Location Back    Number of Test 59    1. Control-Buffer 50% Glycerol Negative    2. Control-Histamine 1 mg/ml 2+    3. Albumin saline Negative    4. Bahia Negative    5. French Southern Territories Negative    6. Johnson Negative    7. Kentucky Blue Negative    8. Meadow Fescue Negative    9. Perennial Rye Negative    10. Sweet Vernal Negative    11. Timothy Negative    12. Cocklebur Negative    13. Burweed Marshelder Negative    14. Ragweed, short Negative    15. Ragweed, Giant Negative    16. Plantain,  English Negative    17. Lamb's Quarters Negative    18. Sheep Sorrell Negative    19. Rough Pigweed Negative    20. Marsh Elder, Rough Negative    21. Mugwort, Common  Negative    22. Ash mix Negative    23. Birch mix Negative    24. Beech American Negative    25. Box, Elder Negative    26. Cedar, red Negative    27. Cottonwood, Guinea-Bissau Negative    28. Elm mix Negative    29. Hickory Negative    30. Maple mix Negative    31. Oak, Guinea-[place]iss[place] mix Negative    32. Pecan Pollen Negative    33. Pine mix Negative    34. Sycamore Eastern Negative    35. Walnut, Black Pollen Negative    36. Alternaria alternata Negative    37. Cladosporium Herbarum Negative    38. Aspergillus mix Negative    39. Penicillium mix Negative    40. Bipolaris sorokiniana (Helminthosporium) Negative    41. Drechslera spicifera (Curvularia) Negative    42. Mucor plumbeus Negative    43. Fusarium moniliforme Negative    44. Aureobasidium pullulans (pullulara) Negative    45. Rhizopus oryzae Negative    46. Botrytis cinera Negative    47. Epicoccum nigrum Negative    48. Phoma betae Negative    49. Candida Albicans Negative    50. Trichophyton mentagrophytes Negative    51. Mite, D Farinae  5,000 AU/ml 2+    52. Mite, D Pteronyssinus  5,000 AU/ml 2+    53. Cat Hair 10,000 BAU/ml Negative    54.  Dog Epithelia Negative    55. Mixed Feathers Negative    56. Horse  Epithelia Negative    57. Cockroach, German Negative    58. Mouse Negative    59. Tobacco Leaf Negative             Food Adult Perc - 10/27/21 1000     Time Antigen Placed 1011    Allergen Manufacturer Waynette ButteryGreer    Location Back     Control-buffer 50% Glycerol Negative    Control-Histamine 1 mg/ml 2+    1. Peanut Negative    2. Soybean Negative    3. Wheat Negative    4. Sesame Negative    5. Milk, cow Negative    6. Egg White, Chicken Negative    7. Casein Negative    8. Shellfish Mix Negative    9. Fish Mix Negative    10. Cashew Negative    11. Pecan Food Negative    12. Walnut Food Negative    13. Almond Negative    14. Hazelnut Negative    15. [place] nut Negative    16. Coconut Negative    17.  Pistachio Negative    30. Barley Negative    31. Oat  Negative    32. Rye  Negative    33. Hops Negative    34. Rice Negative    37. Pork Negative    40. Beef Negative    41. Lamb Negative             Allergy testing results were read and interpreted by myself, documented by clinical staff.         Malachi BondsJoel Caylan Chenard, MD [facility]

## 2021-10-27 NOTE — Patient Instructions (Addendum)
Moderate persistent asthma - with poor lung testing today Lung function looked terrible today and surprisingly it did not get much better with the albuterol puffs. We are going to get some labs to look for serious difficult to control causes of asthma/breathing problems.  We are going to start a prednisone burst today. I also think that we need to restart the Symbicort two puffs twice daily EVERY day. I want to see if this medication can help reverse your lung function. Consider starting an injectable medication like Fasenra for better control of your breathing. This medication targets a white blood cell called eosinophils which can hang out in the airways and make your breathing worse.  Controller: Symbicort two puffs twice daily with spacer Before physical activity (if this is a trigger): albuterol 2 puffs 15 minutes before physical activity Rescue: 4 puffs of albuterol every 4-6 hours  Chronic rhinitis Testing today showed: positives only to dust mites Copy of testing results provided. Avoidance measures provided.  Start taking nasal Atrovent one spray per nostril every 8 hours as needed (CAN BE OVER DRYING, so be careful). Start taking carbinoxmine 4 mg every 8 hours as needed (CAN CAUSE SLEEPINESS, but this gets better over time).   Adverse food reaction Testing was negative to all of the foods that we tested today. Food has great negative predictive value, so if this is negative we are likely to believe it.  Testing ruled out > 95% of all food allergies today. Copy of the testing results provided today. We are going to confirm the shellfish with some labs today.  4. Follow up in 2-4 weeks or sooner if needed.     Airborne Adult Perc - 10/27/21 1011     Time Antigen Placed 1011    Allergen Manufacturer Waynette Buttery    Location Back    Number of Test 59    1. Control-Buffer 50% Glycerol Negative    2. Control-Histamine 1 mg/ml 2+    3. Albumin saline Negative    4. Bahia  Negative    5. French Southern Territories Negative    6. Johnson Negative    7. Kentucky Blue Negative    8. Meadow Fescue Negative    9. Perennial Rye Negative    10. Sweet Vernal Negative    11. Timothy Negative    12. Cocklebur Negative    13. Burweed Marshelder Negative    14. Ragweed, short Negative    15. Ragweed, Giant Negative    16. Plantain,  English Negative    17. Lamb's Quarters Negative    18. Sheep Sorrell Negative    19. Rough Pigweed Negative    20. Marsh Elder, Rough Negative    21. Mugwort, Common Negative    22. Ash mix Negative    23. Birch mix Negative    24. Beech American Negative    25. Box, Elder Negative    26. Cedar, red Negative    27. Cottonwood, Guinea-Bissau Negative    28. Elm mix Negative    29. Hickory Negative    30. Maple mix Negative    31. Oak, Guinea-[place] mix Negative    32. Pecan Pollen Negative    33. Pine mix Negative    34. Sycamore Eastern Negative    35. Walnut, Black Pollen Negative    36. Alternaria alternata Negative    37. Cladosporium Herbarum Negative    38. Aspergillus mix Negative    39. Penicillium mix Negative    40. Bipolaris sorokiniana (  Helminthosporium) Negative    41. Drechslera spicifera (Curvularia) Negative    42. Mucor plumbeus Negative    43. Fusarium moniliforme Negative    44. Aureobasidium pullulans (pullulara) Negative    45. Rhizopus oryzae Negative    46. Botrytis cinera Negative    47. Epicoccum nigrum Negative    48. Phoma betae Negative    49. Candida Albicans Negative    50. Trichophyton mentagrophytes Negative    51. Mite, D Farinae  5,000 AU/ml 2+    52. Mite, D Pteronyssinus  5,000 AU/ml 2+    53. Cat Hair 10,000 BAU/ml Negative    54.  Dog Epithelia Negative    55. Mixed Feathers Negative    56. Horse Epithelia Negative    57. Cockroach, German Negative    58. Mouse Negative    59. Tobacco Leaf Negative             Food Adult Perc - 10/27/21 1000     Time Antigen Placed 1011    Allergen Manufacturer  Waynette Buttery    Location Back     Control-buffer 50% Glycerol Negative    Control-Histamine 1 mg/ml 2+    1. Peanut Negative    2. Soybean Negative    3. Wheat Negative    4. Sesame Negative    5. Milk, cow Negative    6. Egg White, Chicken Negative    7. Casein Negative    8. Shellfish Mix Negative    9. Fish Mix Negative    10. Cashew Negative    11. Pecan Food Negative    12. Walnut Food Negative    13. Almond Negative    14. Hazelnut Negative    15. [place] nut Negative    16. Coconut Negative    17. Pistachio Negative    30. Barley Negative    31. Oat  Negative    32. Rye  Negative    33. Hops Negative    34. Rice Negative    37. Pork Negative    40. Beef Negative    41. Lamb Negative             Control of Dust Mite Allergen    Dust mites play a major role in allergic asthma and rhinitis.  They occur in environments with high humidity wherever human skin is found.  Dust mites absorb humidity from the atmosphere (ie, they do not drink) and feed on organic matter (including shed human and animal skin).  Dust mites are a microscopic type of insect that you cannot see with the naked eye.  High levels of dust mites have been detected from mattresses, pillows, carpets, upholstered furniture, bed covers, clothes, soft toys and any woven material.  The principal allergen of the dust mite is found in its feces.  A gram of dust may contain 1,000 mites and 250,000 fecal particles.  Mite antigen is easily measured in the air during house cleaning activities.  Dust mites do not bite and do not cause harm to humans, other than by triggering allergies/asthma.    Ways to decrease your exposure to dust mites in your home:  Encase mattresses, box springs and pillows with a mite-impermeable barrier or cover   Wash sheets, blankets and drapes weekly in hot water (130 F) with detergent and dry them in a dryer on the hot setting.  Have the room cleaned frequently with a vacuum cleaner and a  damp dust-mop.  For carpeting or rugs, vacuuming with  a vacuum cleaner equipped with a high-efficiency particulate air (HEPA) filter.  The dust mite allergic individual should not be in a room which is being cleaned and should wait 1 hour after cleaning before going into the room. Do not sleep on upholstered furniture (eg, couches).   If possible removing carpeting, upholstered furniture and drapery from the home is ideal.  Horizontal blinds should be eliminated in the rooms where the person spends the most time (bedroom, study, television room).  Washable vinyl, roller-type shades are optimal. Remove all non-washable stuffed toys from the bedroom.  Wash stuffed toys weekly like sheets and blankets above.   Reduce indoor humidity to less than 50%.  Inexpensive humidity monitors can be purchased at most hardware stores.  Do not use a humidifier as can make the problem worse and are not recommended.

## 2022-01-23 ENCOUNTER — Ambulatory Visit: Payer: BC Managed Care – PPO | Admitting: Allergy & Immunology

## 2022-02-26 IMAGING — DX DG CHEST 2V
2 series · 2 of 2 positions shown · non-contrast
Comparison: May 23, 2017.

CLINICAL DATA: Cough, dyspnea.

EXAM:
CHEST - 2 VIEW

[chest pa]
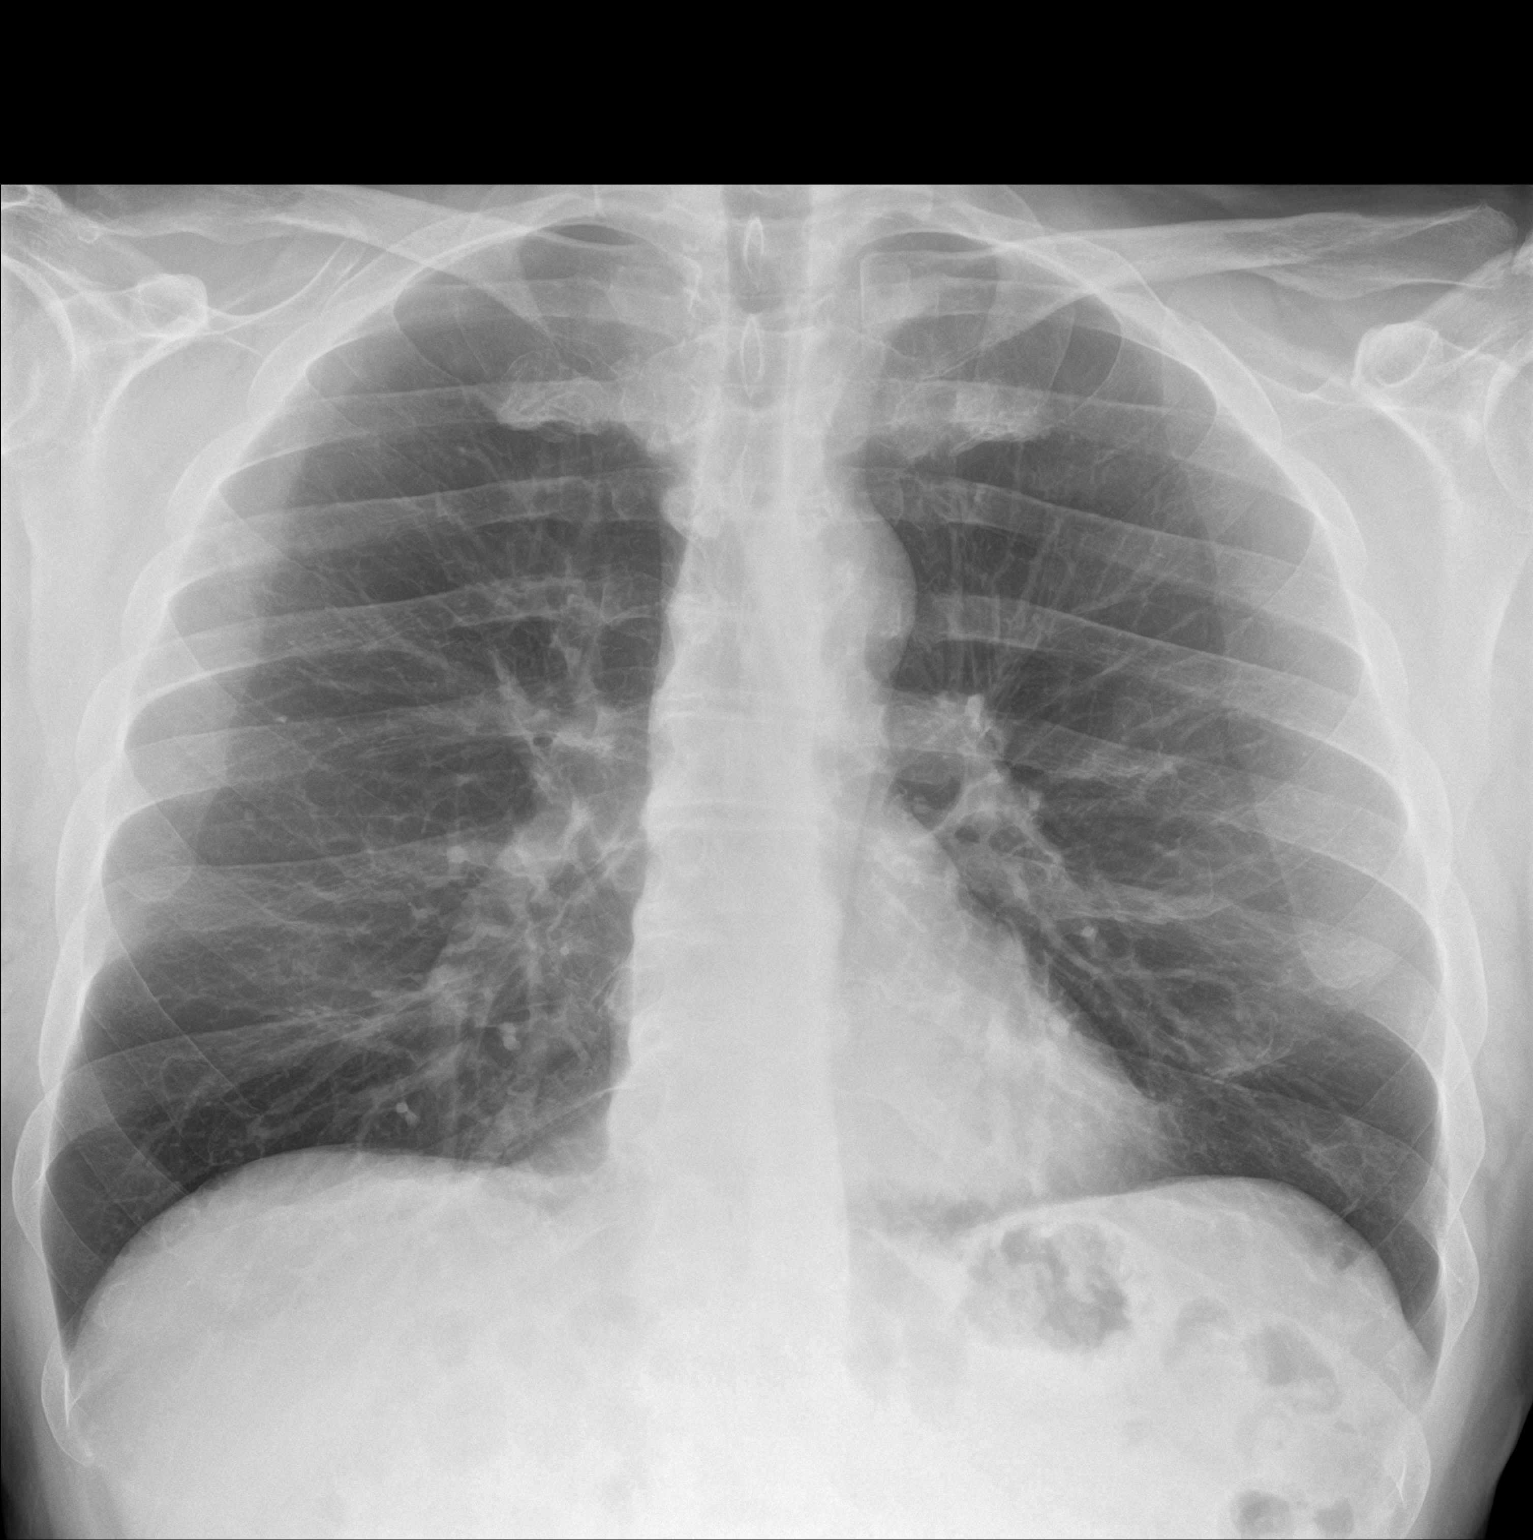

[chest lat]
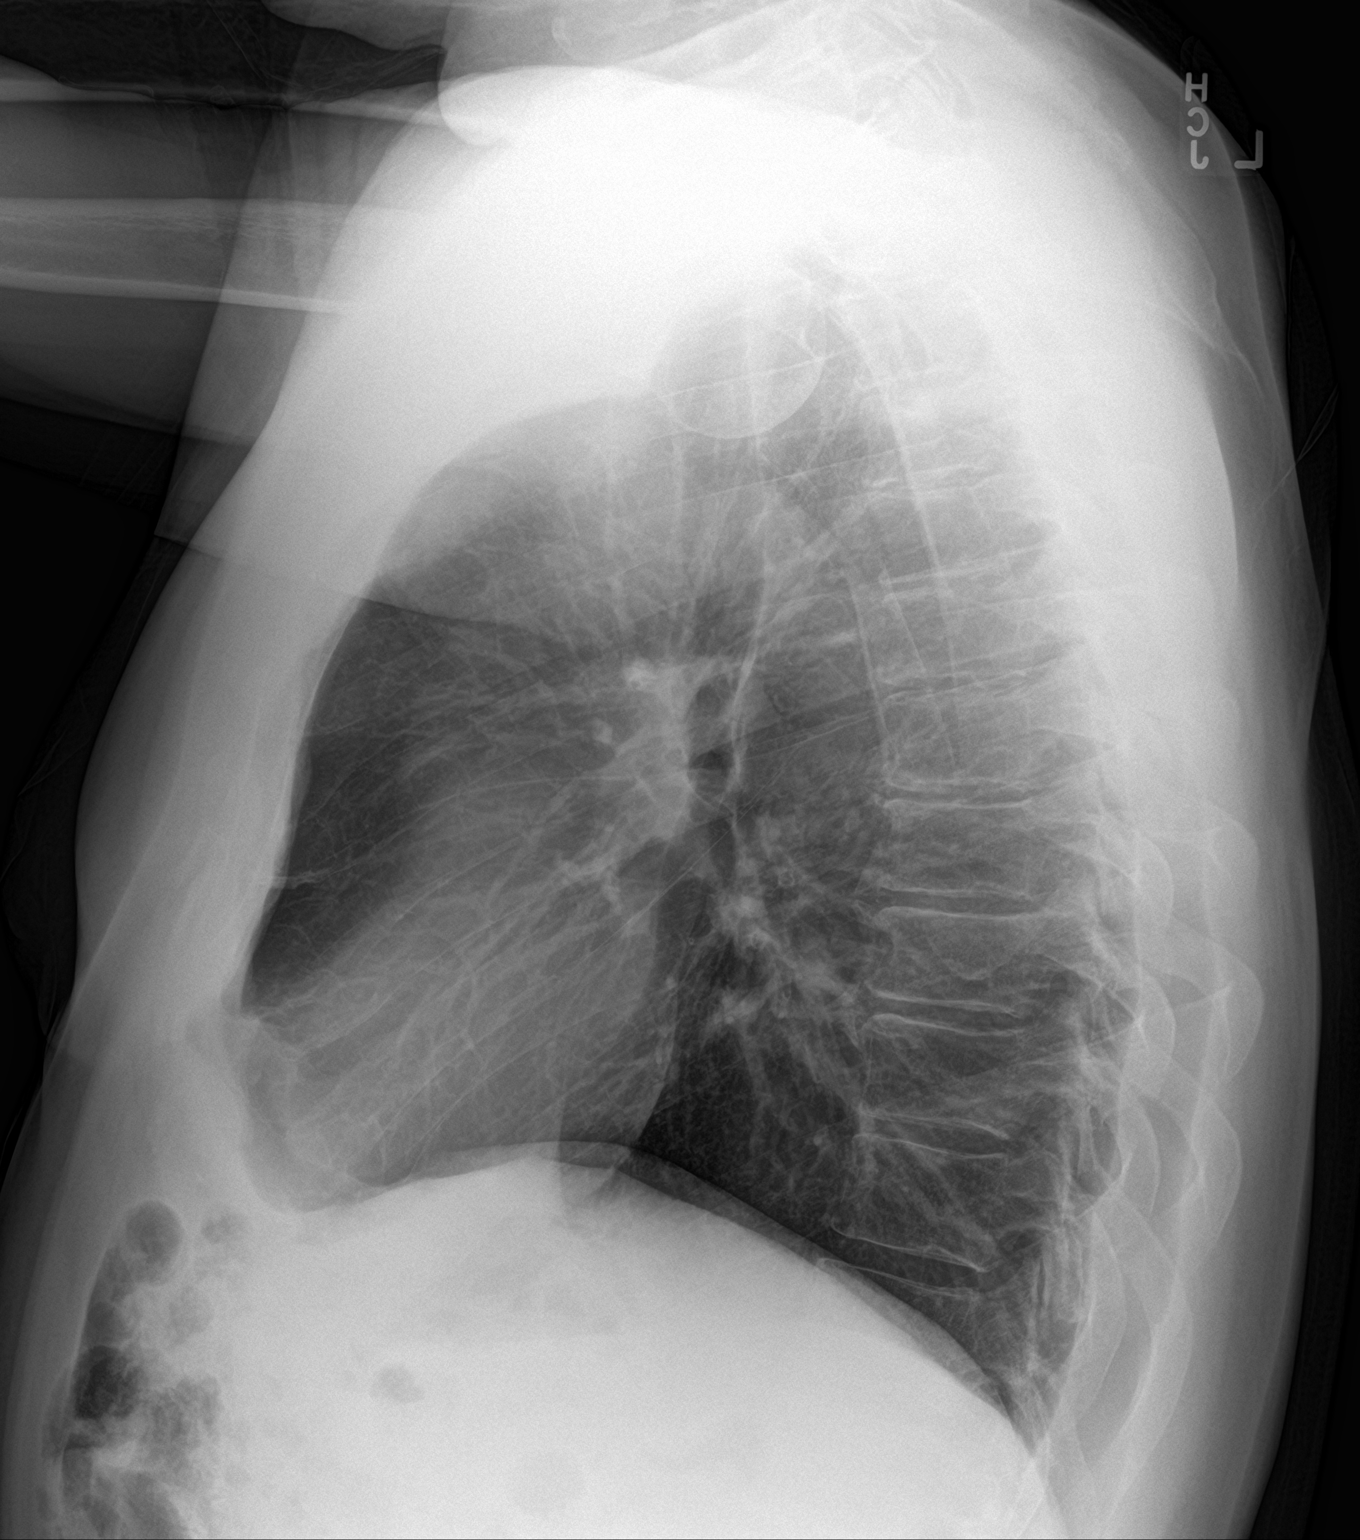

[2 of 2 positions shown; findings below may reference images not displayed]

FINDINGS: The heart size and mediastinal contours are within normal limits.
Both lungs are clear. The visualized skeletal structures are
unremarkable.
IMPRESSION: No active cardiopulmonary disease.

## 2022-08-18 ENCOUNTER — Other Ambulatory Visit: Payer: Self-pay | Admitting: Internal Medicine

## 2022-08-18 DIAGNOSIS — J4489 Other specified chronic obstructive pulmonary disease: Secondary | ICD-10-CM

## 2022-08-20 ENCOUNTER — Telehealth: Payer: Self-pay | Admitting: Internal Medicine

## 2022-08-20 DIAGNOSIS — J4489 Other specified chronic obstructive pulmonary disease: Secondary | ICD-10-CM

## 2022-08-20 MED ORDER — BUDESONIDE-FORMOTEROL FUMARATE 160-4.5 MCG/ACT IN AERO: INHALATION_SPRAY | RESPIRATORY_TRACT | 11 refills | Status: AC

## 2022-08-20 NOTE — Telephone Encounter (Signed)
Refill sent. Nothing further needed at this time.
